# Patient Record
Sex: Male | Born: 1985 | Race: White | Hispanic: No | Marital: Single | Smoking: Current every day smoker
Health system: Southern US, Community
[De-identification: ages and names within clinical notes are randomized; demographics above are authoritative.]

## PROBLEM LIST (undated history)

## (undated) DIAGNOSIS — L0390 Cellulitis, unspecified: Secondary | ICD-10-CM

## (undated) HISTORY — PX: ANKLE SURGERY: SHX546

---

## 2011-08-02 DIAGNOSIS — L02419 Cutaneous abscess of limb, unspecified: Secondary | ICD-10-CM

## 2011-08-02 DIAGNOSIS — L03119 Cellulitis of unspecified part of limb: Secondary | ICD-10-CM

## 2020-10-24 ENCOUNTER — Encounter (HOSPITAL_COMMUNITY): Payer: Self-pay | Admitting: *Deleted

## 2020-10-24 ENCOUNTER — Emergency Department (HOSPITAL_COMMUNITY)
Admission: EM | Admit: 2020-10-24 | Discharge: 2020-10-24 | Disposition: A | Discharge status: Home / Self Care | Payer: Medicaid - Out of State | Attending: Emergency Medicine | Admitting: Emergency Medicine

## 2020-10-24 ENCOUNTER — Other Ambulatory Visit: Payer: Self-pay

## 2020-10-24 DIAGNOSIS — Z5321 Procedure and treatment not carried out due to patient leaving prior to being seen by health care provider: Secondary | ICD-10-CM | POA: Diagnosis not present

## 2020-10-24 DIAGNOSIS — W228XXA Striking against or struck by other objects, initial encounter: Secondary | ICD-10-CM | POA: Diagnosis not present

## 2020-10-24 DIAGNOSIS — S0990XA Unspecified injury of head, initial encounter: Secondary | ICD-10-CM | POA: Diagnosis not present

## 2020-10-24 HISTORY — DX: Cellulitis, unspecified: L03.90

## 2020-10-24 NOTE — ED Triage Notes (Signed)
States he hit his head on a [employer] a couple of days ago and it still hurts, denies LOC

## 2020-12-15 ENCOUNTER — Emergency Department (HOSPITAL_COMMUNITY): Payer: Medicaid - Out of State

## 2020-12-15 ENCOUNTER — Emergency Department (HOSPITAL_COMMUNITY)
Admission: EM | Admit: 2020-12-15 | Discharge: 2020-12-15 | Disposition: A | Discharge status: Home / Self Care | Payer: Medicaid - Out of State | Attending: Emergency Medicine | Admitting: Emergency Medicine

## 2020-12-15 ENCOUNTER — Other Ambulatory Visit: Payer: Self-pay

## 2020-12-15 ENCOUNTER — Encounter (HOSPITAL_COMMUNITY): Payer: Self-pay

## 2020-12-15 DIAGNOSIS — F17210 Nicotine dependence, cigarettes, uncomplicated: Secondary | ICD-10-CM | POA: Insufficient documentation

## 2020-12-15 DIAGNOSIS — S3992XA Unspecified injury of lower back, initial encounter: Secondary | ICD-10-CM

## 2020-12-15 DIAGNOSIS — W182XXA Fall in (into) shower or empty bathtub, initial encounter: Secondary | ICD-10-CM | POA: Diagnosis not present

## 2020-12-15 MED ORDER — IBUPROFEN 800 MG PO TABS
800.0000 mg | ORAL_TABLET | Freq: Once | ORAL | Status: AC
  Administered 2020-12-15: 800 mg via ORAL
  Filled 2020-12-15: qty 1

## 2020-12-15 MED ORDER — ONDANSETRON HCL 4 MG PO TABS
4.0000 mg | ORAL_TABLET | Freq: Once | ORAL | Status: AC
  Administered 2020-12-15: 4 mg via ORAL
  Filled 2020-12-15: qty 1

## 2020-12-15 NOTE — ED Triage Notes (Signed)
Per EMs- patient is from the [facility] where he is currently living. Patient reports that he slipped coming out of the shower, hitting his back. Patient has a a small bruise to the mid back. Patient denies hitting his head, having loc, or taking blood thinners.

## 2020-12-15 NOTE — Discharge Instructions (Signed)
You were seen today for evaluation of a back injury that occurred earlier today. Xray of your lumbar and thoracic spine were negative for fracture or other acute injury. This is likely an impact injury and will improve over the course of the week. Ice the area for the next 24 hours for 20 min at a time. Ibuprofen as needed for pain.

## 2020-12-15 NOTE — ED Provider Notes (Signed)
[facility]-EMERGENCY DEPT Provider Note   CSN: [id number] Arrival date & time: 12/15/20  1158     History Chief Complaint  Patient presents with   Zachary Jury Dalin Mann is a 35 y.o. male who presents here for evaluation of back pain subsequent to a fall that he had earlier today.  Patient states that he was stepping out of his bathtub from the shower when he slipped and fell backwards, landing on his mid back on the front of the toilet near the bathtub.  He denies dizziness or weakness leading to the fall.  He does endorse some mild left leg numbness in the posterior thigh and some mild nausea.  Patient states that he has no known health issues, however he said he was told to take Lasix for fluid overload and decided not to because he does not like to take medication.  He denies head injury, LOC, vision changes, weakness.  He has no other complaints   Fall Pertinent negatives include no abdominal pain, no headaches and no shortness of breath.      Past Medical History:  Diagnosis Date   Cellulitis     There are no problems to display for this patient.   Past Surgical History:  Procedure Laterality Date   ANKLE SURGERY Left        Family History  Family history unknown: Yes    Social History   Tobacco Use   Smoking status: Every Day    Packs/day: 2.00    Types: Cigarettes   Smokeless tobacco: Never  Vaping Use   Vaping Use: Never used  Substance Use Topics   Alcohol use: Never   Drug use: Never    Home Medications Prior to Admission medications   Not on File    Allergies    Patient has no known allergies.  Review of Systems   Review of Systems  Constitutional:  Negative for fever.  HENT: Negative.    Eyes: Negative.   Respiratory:  Negative for shortness of breath.   Cardiovascular: Negative.   Gastrointestinal:  Negative for abdominal pain and vomiting.  Endocrine: Negative.   Genitourinary: Negative.    Musculoskeletal:  Positive for back pain.  Skin:  Negative for rash.  Neurological:  Positive for numbness. Negative for headaches.  All other systems reviewed and are negative.  Physical Exam Updated Vital Signs BP (!) 145/74 (BP Location: Left Arm)   Pulse 78   Temp 98 F (36.7 C) (Oral)   Resp 16   Ht 6' (1.829 m)   Wt (!) 195 kg   SpO2 98%   BMI 58.32 kg/m   Physical Exam Vitals and nursing note reviewed.  Constitutional:      General: He is not in acute distress.    Appearance: He is obese. He is not ill-appearing.  HENT:     Head: Atraumatic.  Eyes:     Conjunctiva/sclera: Conjunctivae normal.  Cardiovascular:     Rate and Rhythm: Normal rate and regular rhythm.     Pulses: Normal pulses.          Radial pulses are 2+ on the right side and 2+ on the left side.       Dorsalis pedis pulses are 2+ on the right side and 2+ on the left side.     Heart sounds: No murmur heard.    Comments: Patient has bilateral lower extremity edema that appears chronic in nature.  Also skin changes on  the left leg that are associated with chronic edema. Pulmonary:     Effort: Pulmonary effort is normal. No respiratory distress.     Breath sounds: Normal breath sounds.  Abdominal:     General: Abdomen is flat. There is no distension.     Palpations: Abdomen is soft.     Tenderness: There is no abdominal tenderness.  Musculoskeletal:        General: Normal range of motion.     Cervical back: Normal range of motion.     Comments: There are some early signs of bruising at the level of T11-T12.  He has focal tenderness of the thoracic spine at this area as well.  No other obvious bony deformities, bony crepitus.  Skin:    General: Skin is warm and dry.     Capillary Refill: Capillary refill takes less than 2 seconds.  Neurological:     General: No focal deficit present.     Mental Status: He is alert.     Comments: Sensation intact distally bilaterally of both lower extremities   Psychiatric:        Mood and Affect: Mood normal.    ED Results / Procedures / Treatments   Labs (all labs ordered are listed, but only abnormal results are displayed) Labs Reviewed - No data to display  EKG None  Radiology DG Thoracic Spine 2 View  Result Date: 12/15/2020 CLINICAL DATA:  Back pain after fall. EXAM: THORACIC SPINE 2 VIEWS COMPARISON:  None. FINDINGS: There is no evidence of thoracic spine fracture. Alignment is normal. Anterior osteophyte formation is noted at multiple levels in the lower thoracic spine. IMPRESSION: No acute abnormality seen. Electronically Signed   By: Lupita Raider M.D.   On: 12/15/2020 14:17   DG Lumbar Spine Complete  Result Date: 12/15/2020 CLINICAL DATA:  Low back pain after fall. EXAM: LUMBAR SPINE - COMPLETE 4+ VIEW COMPARISON:  None. FINDINGS: There is no evidence of lumbar spine fracture. Alignment is normal. Intervertebral disc spaces are maintained. Mild anterior osteophyte formation is noted at L4-5. IMPRESSION: Mild degenerative changes as described above. No acute abnormality seen. Electronically Signed   By: Lupita Raider M.D.   On: 12/15/2020 14:19    Procedures Procedures   Medications Ordered in ED Medications  ondansetron (ZOFRAN) tablet 4 mg (has no administration in time range)  ibuprofen (ADVIL) tablet 800 mg (has no administration in time range)    ED Course  I have reviewed the triage vital signs and the nursing notes.  Pertinent labs & imaging results that were available during my care of the patient were reviewed by me and considered in my medical decision making (see chart for details).    MDM Rules/Calculators/A&P                         This is a 35 year old male who presents for evaluation of a mid back injury that occurred earlier today.  Mechanism of injury and symptoms are described as above.  On exam patient is well-appearing, vital signs are normal.  He is able to ambulate, sensation intact of the lower  extremities bilaterally.  Given he had some focal tenderness of the lower thoracic spine, will proceed with spinal x-rays.  Patient also endorses some nausea and he was given Zofran and ibuprofen in the ED with symptomatic improvement.  X-ray of the thoracic and lumbar spine were negative for fractures or acute pathologies.  Less concern for  discitis considering known injury, nontoxic-appearing.  Low suspicion for spinal abscess given normal vitals, nontoxic-appearing, lack of IVDU.  Patient likely has contusion to lower thoracic back that will resolve over the next few days.  RICE protocol indicated.  Ibuprofen as needed for pain.  Red flag symptoms discussed and when to return.  Patient discharged home in good condition Final Clinical Impression(s) / ED Diagnoses Final diagnoses:  Soft tissue injury of back, initial encounter    Rx / DC Orders ED Discharge Orders     None        Janell Quiet, PA-C 12/15/20 1504    Virgina Norfolk, DO 12/16/20 [id number]

## 2022-12-26 IMAGING — CR DG THORACIC SPINE 2V
4 series · 4 of 4 positions shown · non-contrast
Comparison: None.

CLINICAL DATA: Back pain after fall.

EXAM:
THORACIC SPINE 2 VIEWS

[t thoracic spine ap (1 of 2)]
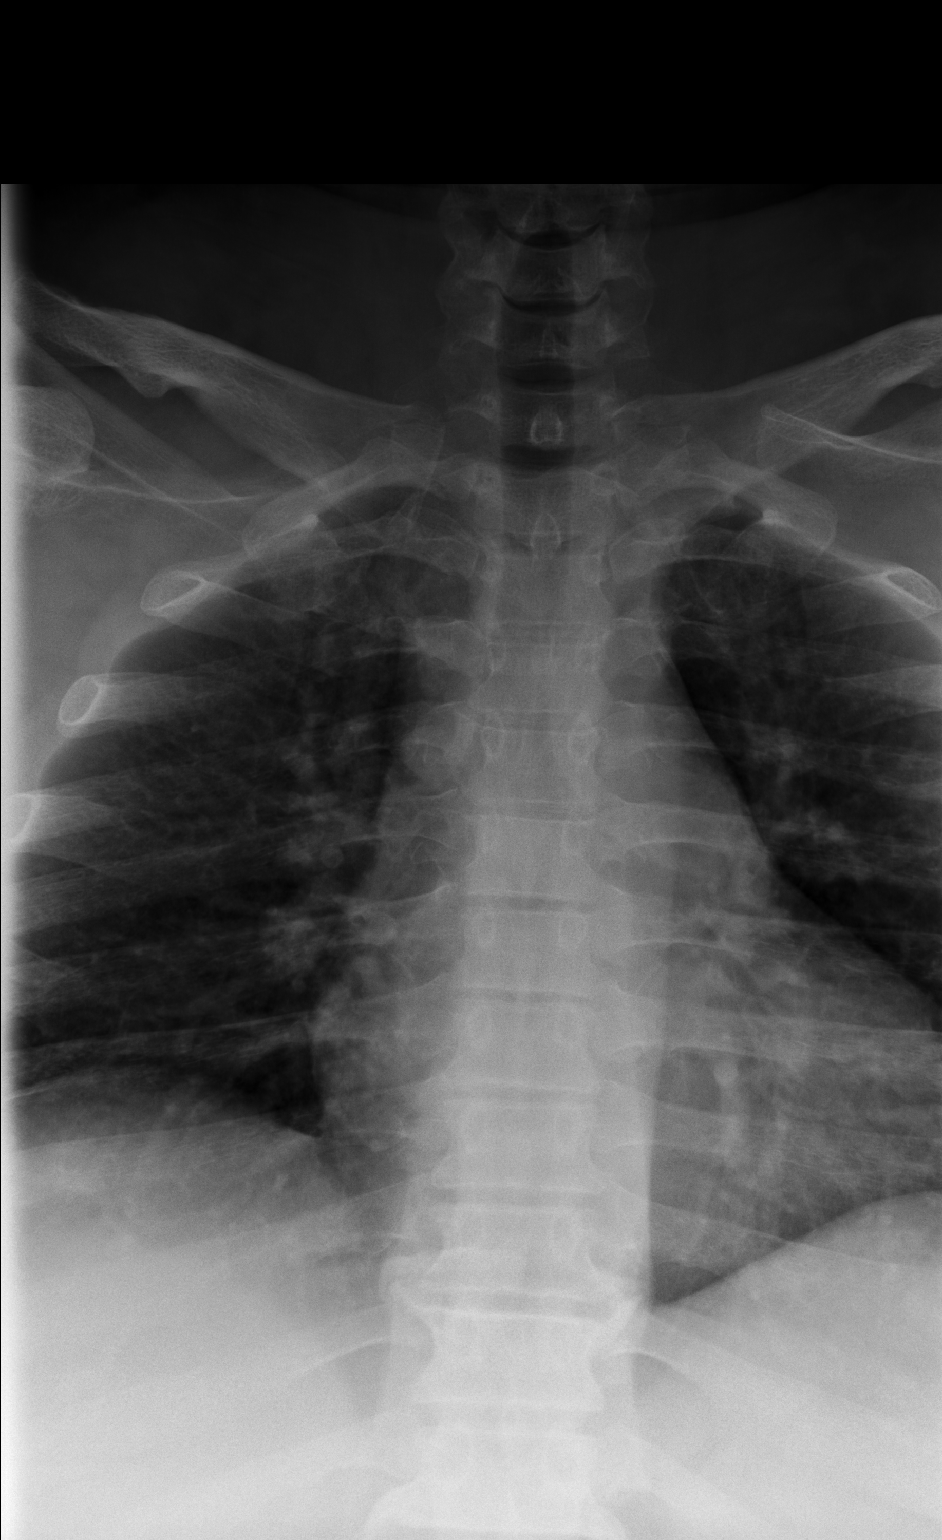

[t thoracic spine ap (2 of 2)]
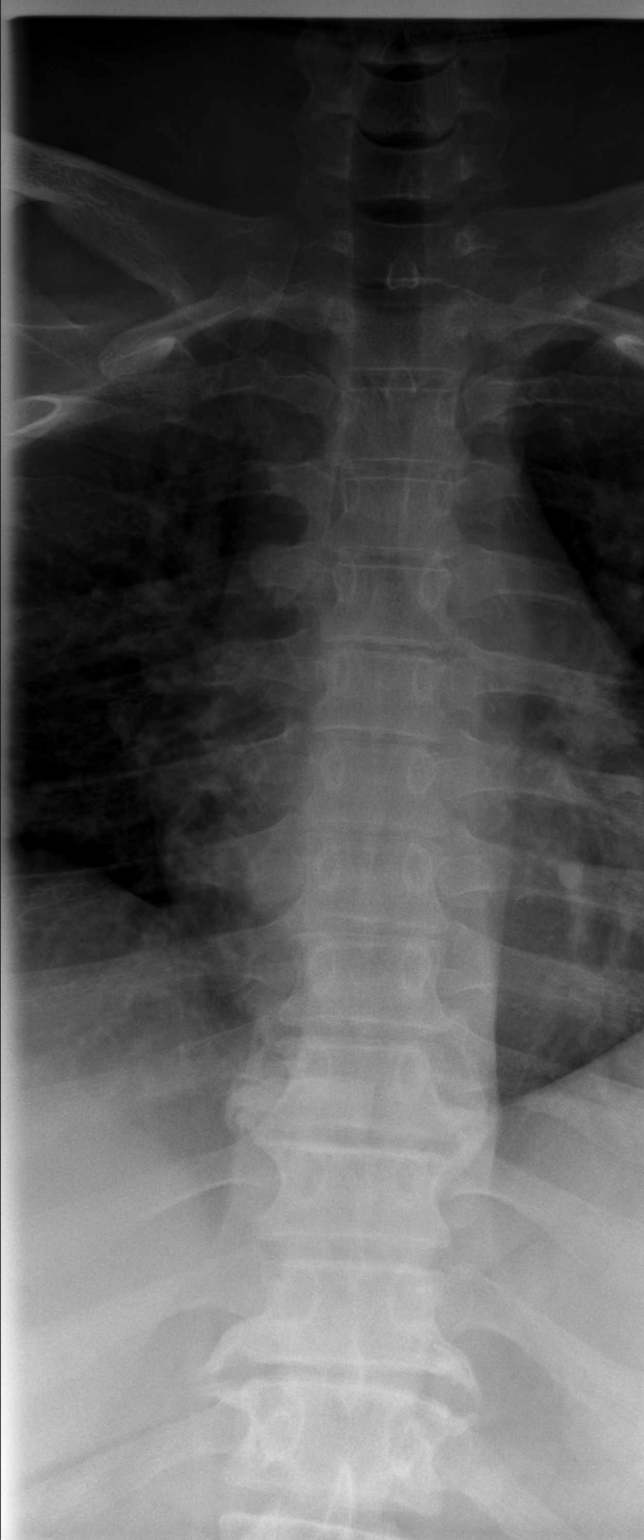

[t thoracic spine lat]
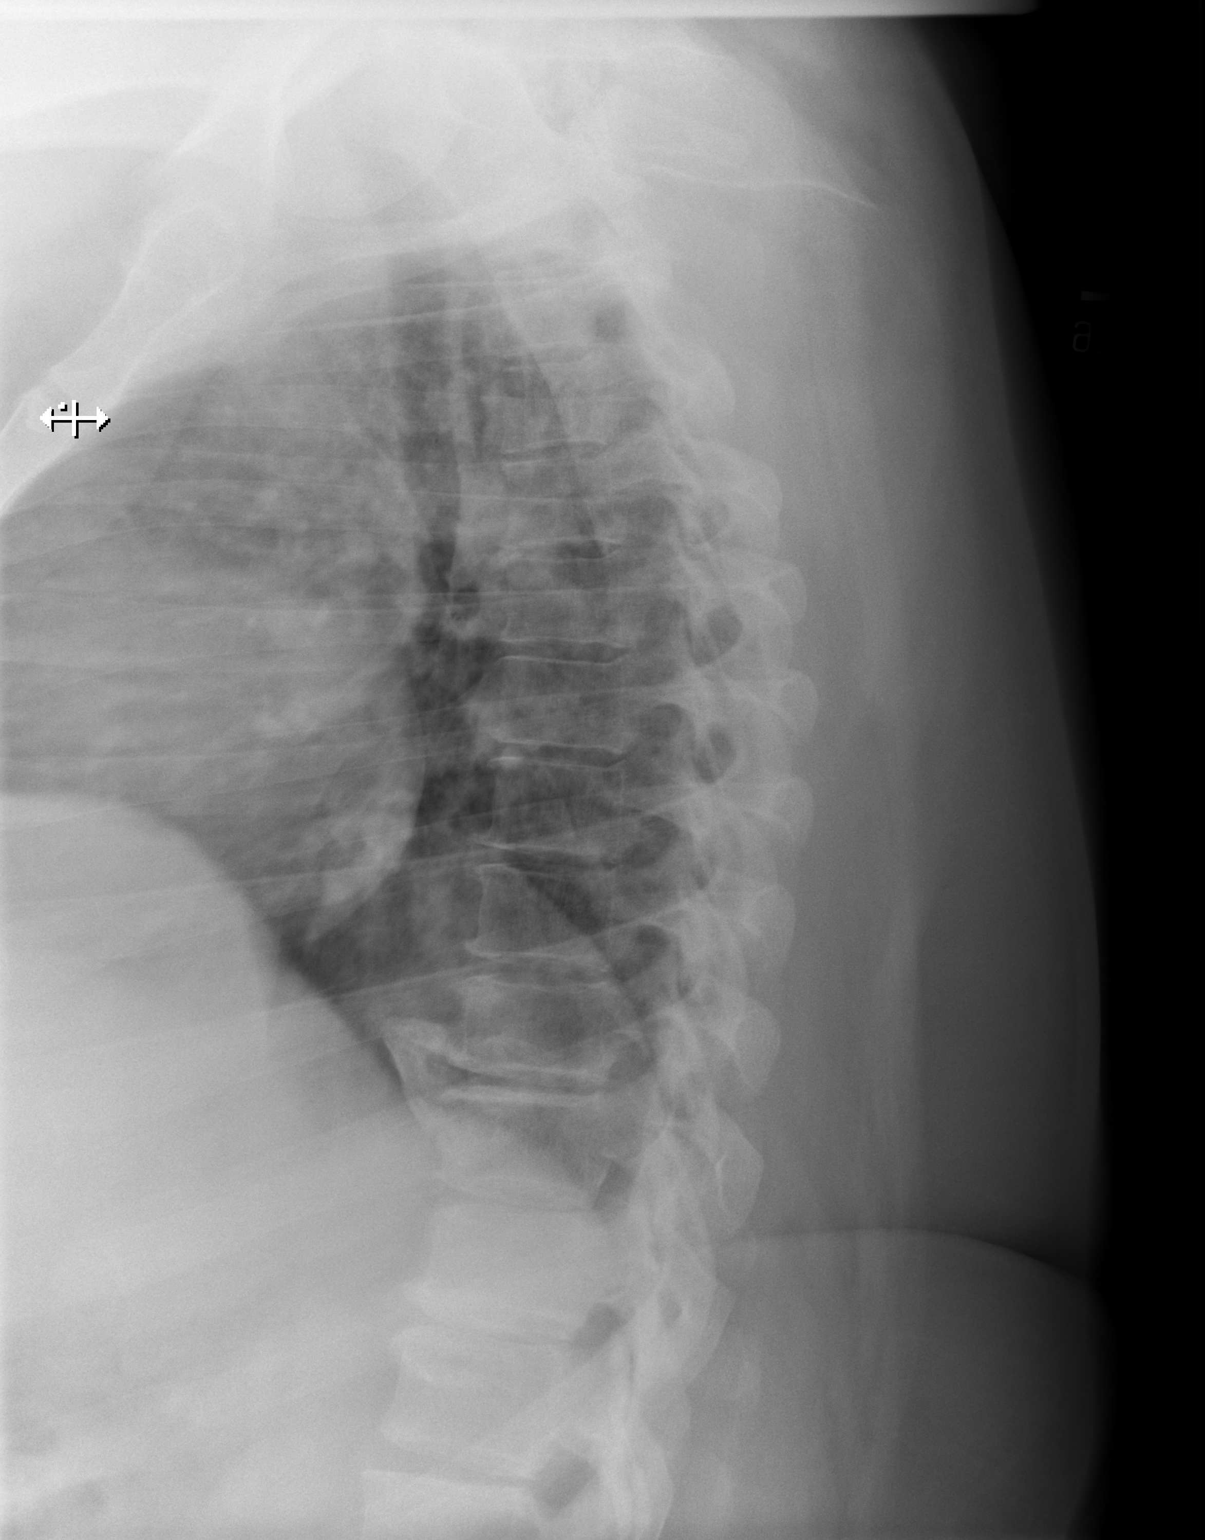

[t thoracic swimmers]
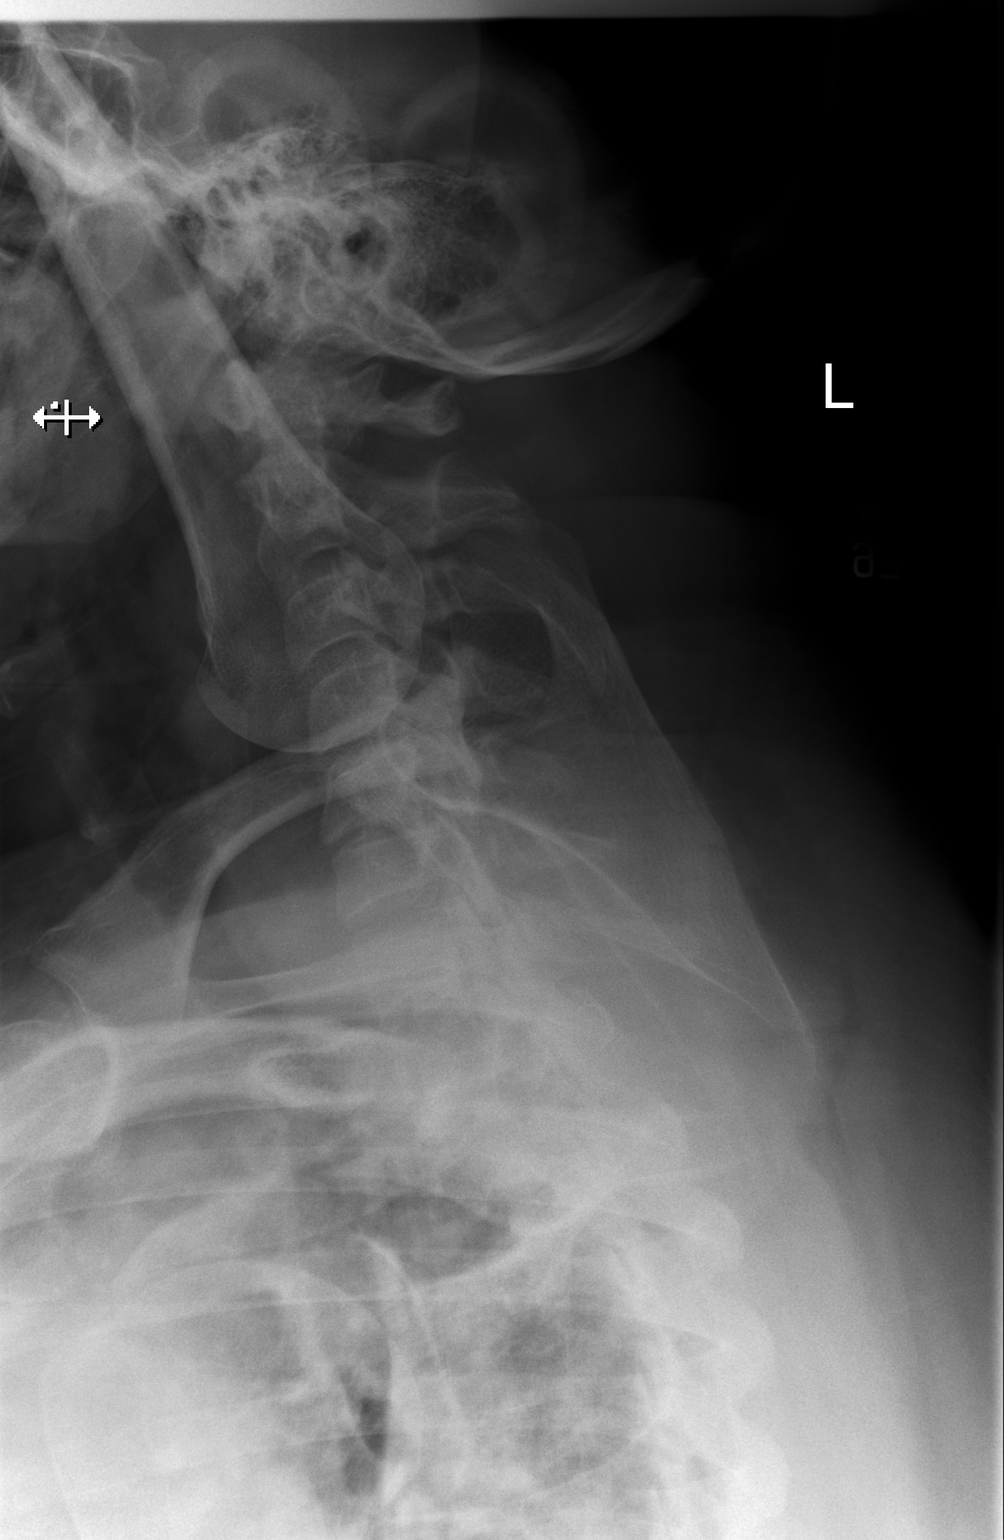

[4 of 4 positions shown; findings below may reference images not displayed]

FINDINGS: There is no evidence of thoracic spine fracture. Alignment is
normal. Anterior osteophyte formation is noted at multiple levels in
the lower thoracic spine.
IMPRESSION: No acute abnormality seen.
